# Patient Record
Sex: Female | Born: 2005
Health system: Southern US, Community
[De-identification: ages and names within clinical notes are randomized; demographics above are authoritative.]

## PROBLEM LIST (undated history)

## (undated) DIAGNOSIS — L83 Acanthosis nigricans: Secondary | ICD-10-CM

## (undated) DIAGNOSIS — L709 Acne, unspecified: Secondary | ICD-10-CM

## (undated) HISTORY — DX: Acanthosis nigricans: L83

## (undated) HISTORY — DX: Acne, unspecified: L70.9

---
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
## 2023-01-23 ENCOUNTER — Ambulatory Visit (INDEPENDENT_AMBULATORY_CARE_PROVIDER_SITE_OTHER): Payer: 59 | Admitting: Pediatric Endocrinology

## 2023-03-15 ENCOUNTER — Encounter (INDEPENDENT_AMBULATORY_CARE_PROVIDER_SITE_OTHER): Payer: Self-pay

## 2023-06-19 ENCOUNTER — Other Ambulatory Visit (HOSPITAL_COMMUNITY): Payer: Self-pay

## 2023-07-12 ENCOUNTER — Other Ambulatory Visit (INDEPENDENT_AMBULATORY_CARE_PROVIDER_SITE_OTHER): Payer: Self-pay | Admitting: Pediatric Endocrinology
